# Patient Record
Sex: Male | Born: 1960 | Race: White | Hispanic: No | Marital: Single | Smoking: Never smoker
Health system: Southern US, Community
[De-identification: ages and names within clinical notes are randomized; demographics above are authoritative.]

## PROBLEM LIST (undated history)

## (undated) DIAGNOSIS — Z460 Encounter for fitting and adjustment of spectacles and contact lenses: Secondary | ICD-10-CM

## (undated) DIAGNOSIS — Z789 Other specified health status: Secondary | ICD-10-CM

## (undated) DIAGNOSIS — G4730 Sleep apnea, unspecified: Secondary | ICD-10-CM

## (undated) HISTORY — PX: COLONOSCOPY: SHX174

## (undated) HISTORY — PX: SHOULDER ARTHROSCOPY: SHX128

---

## 1999-07-24 HISTORY — PX: UVULOPALATOPLASTY: SHX2633

## 1999-07-24 HISTORY — PX: NASAL SEPTOPLASTY W/ TURBINOPLASTY: SHX2070

## 1999-10-30 ENCOUNTER — Encounter: Payer: Self-pay | Admitting: Internal Medicine

## 1999-10-30 ENCOUNTER — Encounter: Admission: RE | Admit: 1999-10-30 | Discharge: 1999-10-30 | Payer: Self-pay | Admitting: Internal Medicine

## 1999-12-29 ENCOUNTER — Encounter: Admission: RE | Admit: 1999-12-29 | Discharge: 1999-12-29 | Payer: Self-pay | Admitting: Neurosurgery

## 1999-12-29 ENCOUNTER — Encounter: Payer: Self-pay | Admitting: Neurosurgery

## 2000-04-10 ENCOUNTER — Ambulatory Visit (HOSPITAL_BASED_OUTPATIENT_CLINIC_OR_DEPARTMENT_OTHER): Admission: RE | Admit: 2000-04-10 | Discharge: 2000-04-10 | Payer: Self-pay | Admitting: Otolaryngology

## 2000-05-29 ENCOUNTER — Observation Stay (HOSPITAL_COMMUNITY): Admission: RE | Admit: 2000-05-29 | Discharge: 2000-05-30 | Payer: Self-pay | Admitting: Otolaryngology

## 2013-01-19 ENCOUNTER — Other Ambulatory Visit: Payer: Self-pay | Admitting: Orthopedic Surgery

## 2013-01-22 ENCOUNTER — Encounter (HOSPITAL_BASED_OUTPATIENT_CLINIC_OR_DEPARTMENT_OTHER): Payer: Self-pay | Admitting: *Deleted

## 2013-01-22 NOTE — Progress Notes (Signed)
No meds-no labs needed 

## 2013-01-26 ENCOUNTER — Encounter (HOSPITAL_BASED_OUTPATIENT_CLINIC_OR_DEPARTMENT_OTHER): Payer: Self-pay | Admitting: *Deleted

## 2013-01-27 ENCOUNTER — Encounter (HOSPITAL_BASED_OUTPATIENT_CLINIC_OR_DEPARTMENT_OTHER): Payer: Self-pay | Admitting: Anesthesiology

## 2013-01-27 ENCOUNTER — Ambulatory Visit (HOSPITAL_BASED_OUTPATIENT_CLINIC_OR_DEPARTMENT_OTHER): Payer: BC Managed Care – PPO | Admitting: Anesthesiology

## 2013-01-27 ENCOUNTER — Ambulatory Visit (HOSPITAL_BASED_OUTPATIENT_CLINIC_OR_DEPARTMENT_OTHER)
Admission: RE | Admit: 2013-01-27 | Discharge: 2013-01-27 | Disposition: A | Discharge status: Home / Self Care | Payer: BC Managed Care – PPO | Source: Ambulatory Visit | Attending: Orthopedic Surgery | Admitting: Orthopedic Surgery

## 2013-01-27 ENCOUNTER — Encounter (HOSPITAL_BASED_OUTPATIENT_CLINIC_OR_DEPARTMENT_OTHER)
Admission: RE | Disposition: A | Discharge status: Home / Self Care | Payer: Self-pay | Source: Ambulatory Visit | Attending: Orthopedic Surgery

## 2013-01-27 ENCOUNTER — Encounter (HOSPITAL_BASED_OUTPATIENT_CLINIC_OR_DEPARTMENT_OTHER): Payer: Self-pay | Admitting: *Deleted

## 2013-01-27 DIAGNOSIS — IMO0002 Reserved for concepts with insufficient information to code with codable children: Secondary | ICD-10-CM | POA: Insufficient documentation

## 2013-01-27 DIAGNOSIS — G4730 Sleep apnea, unspecified: Secondary | ICD-10-CM | POA: Insufficient documentation

## 2013-01-27 DIAGNOSIS — M249 Joint derangement, unspecified: Secondary | ICD-10-CM | POA: Insufficient documentation

## 2013-01-27 DIAGNOSIS — H9190 Unspecified hearing loss, unspecified ear: Secondary | ICD-10-CM | POA: Insufficient documentation

## 2013-01-27 HISTORY — DX: Encounter for fitting and adjustment of spectacles and contact lenses: Z46.0

## 2013-01-27 HISTORY — DX: Other specified health status: Z78.9

## 2013-01-27 HISTORY — PX: TENDON REPAIR: SHX5111

## 2013-01-27 HISTORY — DX: Sleep apnea, unspecified: G47.30

## 2013-01-27 SURGERY — TENDON REPAIR
Anesthesia: Choice | Site: Hand | Laterality: Right | Wound class: Clean

## 2013-01-27 MED ORDER — OXYCODONE HCL 5 MG/5ML PO SOLN: 5.0000 mg | Freq: Once | ORAL | Status: DC | PRN

## 2013-01-27 MED ORDER — LACTATED RINGERS IV SOLN
INTRAVENOUS | Status: DC
  Administered 2013-01-27 (×2): via INTRAVENOUS

## 2013-01-27 MED ORDER — HYDROMORPHONE HCL PF 1 MG/ML IJ SOLN
0.2500 mg | INTRAMUSCULAR | Status: DC | PRN
  Administered 2013-01-27 (×2): 0.5 mg via INTRAVENOUS

## 2013-01-27 MED ORDER — MIDAZOLAM HCL 5 MG/5ML IJ SOLN
INTRAMUSCULAR | Status: DC | PRN
  Administered 2013-01-27: 2 mg via INTRAVENOUS

## 2013-01-27 MED ORDER — BUPIVACAINE HCL (PF) 0.25 % IJ SOLN
INTRAMUSCULAR | Status: DC | PRN
  Administered 2013-01-27: 10 mL

## 2013-01-27 MED ORDER — CEFAZOLIN SODIUM-DEXTROSE 2-3 GM-% IV SOLR
2.0000 g | INTRAVENOUS | Status: AC
  Administered 2013-01-27: 2 g via INTRAVENOUS

## 2013-01-27 MED ORDER — FENTANYL CITRATE 0.05 MG/ML IJ SOLN
INTRAMUSCULAR | Status: DC | PRN
  Administered 2013-01-27: 25 ug via INTRAVENOUS
  Administered 2013-01-27: 50 ug via INTRAVENOUS

## 2013-01-27 MED ORDER — DEXAMETHASONE SODIUM PHOSPHATE 10 MG/ML IJ SOLN
INTRAMUSCULAR | Status: DC | PRN
  Administered 2013-01-27: 10 mg via INTRAVENOUS

## 2013-01-27 MED ORDER — FENTANYL CITRATE 0.05 MG/ML IJ SOLN: 50.0000 ug | INTRAMUSCULAR | Status: DC | PRN

## 2013-01-27 MED ORDER — CHLORHEXIDINE GLUCONATE 4 % EX LIQD: 60.0000 mL | Freq: Once | CUTANEOUS | Status: DC

## 2013-01-27 MED ORDER — LIDOCAINE HCL (CARDIAC) 20 MG/ML IV SOLN
INTRAVENOUS | Status: DC | PRN
  Administered 2013-01-27: 80 mg via INTRAVENOUS

## 2013-01-27 MED ORDER — ONDANSETRON HCL 4 MG/2ML IJ SOLN
INTRAMUSCULAR | Status: DC | PRN
  Administered 2013-01-27: 4 mg via INTRAVENOUS

## 2013-01-27 MED ORDER — PROPOFOL 10 MG/ML IV BOLUS
INTRAVENOUS | Status: DC | PRN
  Administered 2013-01-27: 180 mg via INTRAVENOUS

## 2013-01-27 MED ORDER — OXYCODONE HCL 5 MG PO TABS: 5.0000 mg | ORAL_TABLET | Freq: Once | ORAL | Status: DC | PRN

## 2013-01-27 MED ORDER — HYDROCODONE-ACETAMINOPHEN 5-325 MG PO TABS: ORAL_TABLET | ORAL | Status: DC

## 2013-01-27 MED ORDER — MIDAZOLAM HCL 2 MG/2ML IJ SOLN: 1.0000 mg | INTRAMUSCULAR | Status: DC | PRN

## 2013-01-27 SURGICAL SUPPLY — 93 items
BAG DECANTER FOR FLEXI CONT (MISCELLANEOUS) IMPLANT
BALL CTTN LRG ABS STRL LF (GAUZE/BANDAGES/DRESSINGS)
BANDAGE CONFORM 2  STR LF (GAUZE/BANDAGES/DRESSINGS) IMPLANT
BANDAGE ELASTIC 3 VELCRO ST LF (GAUZE/BANDAGES/DRESSINGS) ×2 IMPLANT
BANDAGE GAUZE ELAST BULKY 4 IN (GAUZE/BANDAGES/DRESSINGS) ×1 IMPLANT
BLADE MINI RND TIP GREEN BEAV (BLADE) IMPLANT
BLADE SURG 15 STRL LF DISP TIS (BLADE) ×2 IMPLANT
BLADE SURG 15 STRL SS (BLADE) ×4
BNDG CMPR 9X4 STRL LF SNTH (GAUZE/BANDAGES/DRESSINGS) ×1
BNDG CMPR MD 5X2 ELC HKLP STRL (GAUZE/BANDAGES/DRESSINGS)
BNDG COHESIVE 3X5 TAN STRL LF (GAUZE/BANDAGES/DRESSINGS) ×1 IMPLANT
BNDG ELASTIC 2 VLCR STRL LF (GAUZE/BANDAGES/DRESSINGS) IMPLANT
BNDG ESMARK 4X9 LF (GAUZE/BANDAGES/DRESSINGS) ×2 IMPLANT
BNDG PLASTER X FAST 3X3 WHT LF (CAST SUPPLIES) ×20 IMPLANT
BNDG PLSTR 9X3 FST ST WHT (CAST SUPPLIES) ×20
CHLORAPREP W/TINT 26ML (MISCELLANEOUS) ×2 IMPLANT
CLOTH BEACON ORANGE TIMEOUT ST (SAFETY) ×2 IMPLANT
CORDS BIPOLAR (ELECTRODE) ×2 IMPLANT
COTTONBALL LRG STERILE PKG (GAUZE/BANDAGES/DRESSINGS) IMPLANT
COVER MAYO STAND STRL (DRAPES) ×2 IMPLANT
COVER TABLE BACK 60X90 (DRAPES) ×2 IMPLANT
CUFF TOURNIQUET SINGLE 18IN (TOURNIQUET CUFF) ×2 IMPLANT
DECANTER SPIKE VIAL GLASS SM (MISCELLANEOUS) IMPLANT
DRAIN TLS ROUND 10FR (DRAIN) IMPLANT
DRAPE EXTREMITY T 121X128X90 (DRAPE) ×2 IMPLANT
DRAPE OEC MINIVIEW 54X84 (DRAPES) IMPLANT
DRAPE SURG 17X23 STRL (DRAPES) ×2 IMPLANT
DRSG PAD ABDOMINAL 8X10 ST (GAUZE/BANDAGES/DRESSINGS) IMPLANT
GAUZE SPONGE 4X4 16PLY XRAY LF (GAUZE/BANDAGES/DRESSINGS) IMPLANT
GAUZE XEROFORM 1X8 LF (GAUZE/BANDAGES/DRESSINGS) ×2 IMPLANT
GLOVE BIO SURGEON STRL SZ7.5 (GLOVE) ×2 IMPLANT
GLOVE BIOGEL PI IND STRL 8 (GLOVE) ×1 IMPLANT
GLOVE BIOGEL PI IND STRL 8.5 (GLOVE) IMPLANT
GLOVE BIOGEL PI INDICATOR 8 (GLOVE) ×1
GLOVE BIOGEL PI INDICATOR 8.5 (GLOVE) ×1
GLOVE SURG ORTHO 8.0 STRL STRW (GLOVE) ×1 IMPLANT
GOWN BRE IMP PREV XXLGXLNG (GOWN DISPOSABLE) ×2 IMPLANT
GOWN PREVENTION PLUS XLARGE (GOWN DISPOSABLE) ×2 IMPLANT
GOWN PREVENTION PLUS XXLARGE (GOWN DISPOSABLE) ×3 IMPLANT
KWIRE 4.0 X .035IN (WIRE) IMPLANT
LOOP VESSEL MAXI BLUE (MISCELLANEOUS) IMPLANT
NDL HYPO 25X1 1.5 SAFETY (NEEDLE) IMPLANT
NDL KEITH (NEEDLE) IMPLANT
NEEDLE HYPO 22GX1.5 SAFETY (NEEDLE) IMPLANT
NEEDLE HYPO 25X1 1.5 SAFETY (NEEDLE) ×2 IMPLANT
NEEDLE KEITH (NEEDLE) IMPLANT
NS IRRIG 1000ML POUR BTL (IV SOLUTION) ×2 IMPLANT
PACK BASIN DAY SURGERY FS (CUSTOM PROCEDURE TRAY) ×2 IMPLANT
PAD CAST 3X4 CTTN HI CHSV (CAST SUPPLIES) ×1 IMPLANT
PAD CAST 4YDX4 CTTN HI CHSV (CAST SUPPLIES) IMPLANT
PADDING CAST ABS 3INX4YD NS (CAST SUPPLIES) ×1
PADDING CAST ABS 4INX4YD NS (CAST SUPPLIES) ×1
PADDING CAST ABS COTTON 3X4 (CAST SUPPLIES) IMPLANT
PADDING CAST ABS COTTON 4X4 ST (CAST SUPPLIES) ×1 IMPLANT
PADDING CAST COTTON 3X4 STRL (CAST SUPPLIES) ×2
PADDING CAST COTTON 4X4 STRL (CAST SUPPLIES)
SLEEVE SCD COMPRESS KNEE MED (MISCELLANEOUS) IMPLANT
SPLINT PLASTER CAST XFAST 3X15 (CAST SUPPLIES) IMPLANT
SPLINT PLASTER CAST XFAST 4X15 (CAST SUPPLIES) IMPLANT
SPLINT PLASTER XTRA FAST SET 4 (CAST SUPPLIES)
SPLINT PLASTER XTRA FASTSET 3X (CAST SUPPLIES)
SPONGE GAUZE 4X4 12PLY (GAUZE/BANDAGES/DRESSINGS) ×2 IMPLANT
STOCKINETTE 4X48 STRL (DRAPES) ×2 IMPLANT
SUT CHROMIC 5 0 P 3 (SUTURE) IMPLANT
SUT ETHIBOND 3-0 V-5 (SUTURE) IMPLANT
SUT ETHILON 3 0 PS 1 (SUTURE) IMPLANT
SUT ETHILON 4 0 PS 2 18 (SUTURE) ×2 IMPLANT
SUT FIBERWIRE 3-0 18 TAPR NDL (SUTURE)
SUT FIBERWIRE 4-0 18 DIAM BLUE (SUTURE)
SUT MERSILENE 2.0 SH NDLE (SUTURE) IMPLANT
SUT MERSILENE 3 0 FS 1 (SUTURE) IMPLANT
SUT MERSILENE 4 0 P 3 (SUTURE) ×1 IMPLANT
SUT POLY BUTTON 15MM (SUTURE) IMPLANT
SUT PROLENE 2 0 SH DA (SUTURE) IMPLANT
SUT PROLENE 6 0 P 1 18 (SUTURE) IMPLANT
SUT SILK 2 0 FS (SUTURE) IMPLANT
SUT SILK 4 0 PS 2 (SUTURE) IMPLANT
SUT STEEL 4 0 V 26 (SUTURE) IMPLANT
SUT VIC AB 2-0 SH 27 (SUTURE) ×2
SUT VIC AB 2-0 SH 27XBRD (SUTURE) IMPLANT
SUT VIC AB 3-0 PS1 18 (SUTURE)
SUT VIC AB 3-0 PS1 18XBRD (SUTURE) IMPLANT
SUT VIC AB 4-0 P-3 18XBRD (SUTURE) IMPLANT
SUT VIC AB 4-0 P3 18 (SUTURE)
SUT VICRYL 4-0 PS2 18IN ABS (SUTURE) IMPLANT
SUT VICRYL RAPIDE 4/0 PS 2 (SUTURE) ×1 IMPLANT
SUTURE FIBERWR 3-0 18 TAPR NDL (SUTURE) IMPLANT
SUTURE FIBERWR 4-0 18 DIA BLUE (SUTURE) IMPLANT
SYR BULB 3OZ (MISCELLANEOUS) ×2 IMPLANT
SYR CONTROL 10ML LL (SYRINGE) ×1 IMPLANT
TOWEL OR 17X24 6PK STRL BLUE (TOWEL DISPOSABLE) ×3 IMPLANT
TUBE FEEDING 5FR 15 INCH (TUBING) IMPLANT
UNDERPAD 30X30 INCONTINENT (UNDERPADS AND DIAPERS) ×2 IMPLANT

## 2013-01-27 NOTE — Brief Op Note (Signed)
01/27/2013  12:26 PM  PATIENT:  Barbaraann Faster [facility]  52 y.o. male  PRE-OPERATIVE DIAGNOSIS:  RIGHT EPL RUPTURE  POST-OPERATIVE DIAGNOSIS:  RIGHT EPL RUPTURE  PROCEDURE:  Procedure(s): EXTENSOR INDICIS PROPRIUS TO EXTENSOR PILLICIS LONGUS TRANSFER  SURGEON:  Surgeon(s): Tami Ribas, MD Nicki Reaper, MD  PHYSICIAN ASSISTANT:   ASSISTANTS: Cindee Salt, MD   ANESTHESIA:   general  EBL:  Total I/O In: 1000 [I.V.:1000] Out: -   DRAINS: none   LOCAL MEDICATIONS USED:  MARCAINE     SPECIMEN:  No Specimen  DISPOSITION OF SPECIMEN:  N/A  COUNTS:  YES  TOURNIQUET:   Total Tourniquet Time Documented: Upper Arm (Right) - 54 minutes Total: Upper Arm (Right) - 54 minutes   DICTATION: .Other Dictation: Dictation Number [id number]  PLAN OF CARE: Discharge to home after PACU

## 2013-01-27 NOTE — H&P (Signed)
Peter Potter is an 52 y.o. male.   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  2 weeks ago was doing a lot of work and noted inability to extend right thumb at IP joint afterward.  No previous issues with this.  No other injury at this time.  Past Medical History  Diagnosis Date  . Medical history non-contributory   . Contact lens/glasses fitting     wears contacts or glasses  . Sleep apnea     had a study 93,surgery-study 08-did not have sleep apnea    Past Surgical History  Procedure Laterality Date  . Shoulder arthroscopy      right shoulder-2005  . Colonoscopy    . Uvulopalatoplasty  2001  . Nasal septoplasty w/ turbinoplasty  2001    History reviewed. No pertinent family history. Social History:  reports that he has never smoked. He does not have any smokeless tobacco history on file. He reports that  drinks alcohol. He reports that he does not use illicit drugs.  Allergies: No Known Allergies  Medications Prior to Admission  Medication Sig Dispense Refill  . Multiple Vitamins-Minerals (MULTIVITAMIN WITH MINERALS) tablet Take 1 tablet by mouth daily.        No results found for this or any previous visit (from the past 48 hour(s)).  No results found.   A comprehensive review of systems was negative except for: Eyes: positive for contacts/glasses Ears, nose, mouth, throat, and face: positive for hearing loss  Blood pressure 119/73, pulse 53, temperature 97.8 F (36.6 C), temperature source Oral, resp. rate 16, height 6' (1.829 m), weight 166 lb 4 oz (75.411 kg), SpO2 100.00%.  General appearance: alert, cooperative and appears stated age Head: Normocephalic, without obvious abnormality, atraumatic Neck: supple, symmetrical, trachea midline Resp: clear to auscultation bilaterally Cardio: regular rate and rhythm GI: non tender Extremities: intact  sensation and capillary refill all digits.  Unable to extend IP joint of right thumb.  intact fpl/io.  left thumb intact epl/fpl/io.  no wounds. Pulses: 2+ and symmetric Skin: Skin color, texture, turgor normal. No rashes or lesions Neurologic: Grossly normal Incision/Wound: na  Assessment/Plan Right thumb EPL laceration.  Non operative and operative treatment options were discussed with the patient and patient wishes to proceed with operative treatment. Discussed primary repair vs PL graft vs EIP to EPL transfer.  Risks, benefits, and alternatives of surgery were discussed and the patient agrees with the plan of care.   Peter Potter R 01/27/2013, 10:45 AM

## 2013-01-27 NOTE — Transfer of Care (Signed)
Immediate Anesthesia Transfer of Care Note  Patient: Peter Potter [facility]  Procedure(s) Performed: Procedure(s): EXTENSOR INDICIS PROPRIUS TO EXTENSOR PILLICIS LONGUS TRANSFER (Right)  Patient Location: PACU  Anesthesia Type:General  Level of Consciousness: awake, sedated and patient cooperative  Airway & Oxygen Therapy: Patient Spontanous Breathing and Patient connected to face mask oxygen  Post-op Assessment: Report given to PACU RN and Post -op Vital signs reviewed and stable  Post vital signs: Reviewed and stable  Complications: No apparent anesthesia complications

## 2013-01-27 NOTE — Anesthesia Procedure Notes (Signed)
Procedure Name: LMA Insertion Date/Time: 01/27/2013 11:17 AM Performed by: Gar Gibbon Pre-anesthesia Checklist: Patient identified, Emergency Drugs available, Suction available and Patient being monitored Patient Re-evaluated:Patient Re-evaluated prior to inductionOxygen Delivery Method: Circle System Utilized Preoxygenation: Pre-oxygenation with 100% oxygen Intubation Type: IV induction Ventilation: Mask ventilation without difficulty LMA: LMA inserted LMA Size: 5.0 Number of attempts: 1 Airway Equipment and Method: bite block Placement Confirmation: positive ETCO2 Tube secured with: Tape Dental Injury: Teeth and Oropharynx as per pre-operative assessment

## 2013-01-27 NOTE — Op Note (Signed)
[id number] 

## 2013-01-27 NOTE — Anesthesia Preprocedure Evaluation (Signed)
Anesthesia Evaluation  Patient identified by MRN, date of birth, ID band Patient awake    Reviewed: Allergy & Precautions, H&P , NPO status , Patient's Chart, lab work & pertinent test results  Airway Mallampati: I TM Distance: >3 FB Neck ROM: Full    Dental   Pulmonary sleep apnea ,  breath sounds clear to auscultation        Cardiovascular Rate:Normal     Neuro/Psych    GI/Hepatic   Endo/Other    Renal/GU      Musculoskeletal   Abdominal   Peds  Hematology   Anesthesia Other Findings   Reproductive/Obstetrics                           Anesthesia Physical Anesthesia Plan  ASA: II  Anesthesia Plan: General   Post-op Pain Management:    Induction: Intravenous  Airway Management Planned: LMA  Additional Equipment:   Intra-op Plan:   Post-operative Plan: Extubation in OR  Informed Consent: I have reviewed the patients History and Physical, chart, labs and discussed the procedure including the risks, benefits and alternatives for the proposed anesthesia with the patient or authorized representative who has indicated his/her understanding and acceptance.     Plan Discussed with: CRNA and Surgeon  Anesthesia Plan Comments:         Anesthesia Quick Evaluation

## 2013-01-27 NOTE — Op Note (Signed)
[name], [name]:  [id number]  MEDICAL RECORD NO.:  [id number]  LOCATION:                                 FACILITY:  PHYSICIAN:  Betha Loa, MD             DATE OF BIRTH:  DATE OF PROCEDURE:  01/27/2013 DATE OF DISCHARGE:                              OPERATIVE REPORT   PREOPERATIVE DIAGNOSIS:  Right thumb extensor pollicis longus rupture.  POSTOPERATIVE DIAGNOSIS:  Right thumb extensor pollicis longus rupture.  PROCEDURE:  Right thumb EIP to EPL transfer.  SURGEON:  Betha Loa, MD  ASSISTANT:  Cindee Salt, MD.  ANESTHESIA:  General.  IV FLUIDS:  Per anesthesia flow sheet.  ESTIMATED BLOOD LOSS:  Minimal.  COMPLICATIONS:  None.  SPECIMENS:  None.  TOURNIQUET TIME:  54 minutes.  DISPOSITION:  Stable to PACU.  INDICATIONS:  Peter Potter is a 52 year old male who states approximately 8 months ago he lacerated the dorsum of his wrist with an X-Acto knife. He did not seek medical attention.  He had no deficits.  He noted approximately 2 weeks ago he was using his hand for standing and was unable to extend the IP joint of his right thumb.  He presented to the office.  On evaluation, he had inability to extend the IP joint of the thumb.  I discussed with Peter Potter the nature of the condition.  We discussed nonoperative and operative treatment options.  Risks, benefits, and alternatives or surgery were discussed including the risk of blood loss, infection, damage to nerves, vessels, tendons, ligaments, bone; failure of surgery; need for additional surgery, complications with wound healing, continued pain, and continued loss of range of motion.  He voiced understanding of these risks and elected to proceed.  OPERATIVE COURSE:  After being identified preoperatively by myself, the patient and I agreed upon procedure and site of procedure.  Surgical site was marked.  The risks, benefits, and alternatives of surgery were reviewed and he wished to  proceed.  Surgical consent had been signed. He was given IV Ancef as preoperative antibiotic prophylaxis.  He was transported to the operating room and placed on the operating table in a supine position with the right upper extremity on arm board.  General anesthesia was induced by the anesthesiologist.  The right upper extremity was prepped and draped in normal sterile orthopedic fashion. Surgical pause was performed between surgeons, anesthesia, and operating room staff, and all were in agreement as to the patient, procedure, and site of procedure.  Tourniquet at the proximal aspect of the extremity was inflated to 250 mmHg after exsanguination of the limb with Esmarch bandage.  Incision was made at the dorsum of the thumb just distal to the wrist joint where the healed laceration was.  This was carried into subcutaneous tissues by spreading technique.  Bipolar electrocautery was used to obtain hemostasis.  Superficial branches of the radial nerve were protected.  Pseudo tendon was identified.  There was a very thick pseudo tendon.  This had ruptured.  The distal end of the actual tendon was identified.  There was minimal actual  tendon fibers at the proximal aspect of the pseudo tendon.  Incision was made at the dorsum of the wrist.  This was carried into subcutaneous tissues by spreading technique.  Again,  bipolar electrocautery was used to obtain hemostasis.  The 4th dorsal compartment was identified.  It was divided for approximately 4 mm at the distal end.  The third dorsal compartment was identified and entered.  There was no tendon within the third dorsal compartment.  There was a very small pseudo tendon.  The proximal end of the tendon was not able to be retrieved.  It was felt that the initial injury was enough that the muscle was contracted and EIP to EPL transfer would be appropriate.  An incision was made at the dorsum of the MP joint of the index finger.  It was carried  into subcutaneous tissues by spreading technique.  The EIP tendon was identified at the wrist incision by its distal muscle belly.  This was the ulnar-sided tendon at the MP joint.  The tendon was divided and the distal tendon stump sewn back to the EDC tendon.  This was done with 4-0 Mersilene suture.  The tendon was retrieved at the wrist incision and then transferred into the thumb incision subcutaneously.  Care was taken to place it deep to the neurovascular structures.  The wrist and index finger incisions were irrigated with sterile saline and closed with 4-0 nylon in a horizontal mattress fashion.  The EIP to EPL transfer was performed in a Pulvertaft fashion.  Three weeds were obtained and the tendon was able to be folded back over itself in the repair.  4-0 Mersilene suture was used to secure this at each of the weed points and proximally.  The tendon edges were able to be sewn back to the tendon to provide a cone shape without any significant edges.  The subcutaneous tissues were freed up to try and prevent any catching.  The tension was set so that with the wrist extended the thumb tip came down to the tip of the index finger and that the thumb fully extended with the wrist in flexion.  The wound was copiously irrigated with sterile saline.  It was closed with 4-0 nylon in a horizontal mattress fashion.  The wounds were then injected with 10 mL of 0.25% plain Marcaine to aid in postoperative analgesia.  They were dressed with sterile Xeroform, 4x4s, and wrapped with a Kerlix bandage. A thumb spica splint with the thumb in full extension was placed and wrapped with Kerlix and Ace bandage.  The tourniquet was deflated at 54 minutes.  Fingertips were pink with brisk capillary refill after deflation of tourniquet.  Operative drapes were broken down and the patient was awoken from anesthesia safely.  He was transferred back to the stretcher and taken to PACU in stable condition.  I  will see him back in the office in 1 week for postoperative followup.  We will give him Norco 5/325, 1-2 p.o. q.6 hours p.r.n. pain, dispensed #40.     Betha Loa, MD     KK/MEDQ  D:  01/27/2013  T:  01/27/2013  Job:  [id number]

## 2013-01-28 ENCOUNTER — Encounter (HOSPITAL_BASED_OUTPATIENT_CLINIC_OR_DEPARTMENT_OTHER): Payer: Self-pay | Admitting: Orthopedic Surgery

## 2013-01-29 NOTE — Anesthesia Postprocedure Evaluation (Signed)
  Anesthesia Post-op Note  Patient: Peter Potter [facility]  Procedure(s) Performed: Procedure(s): EXTENSOR INDICIS PROPRIUS TO EXTENSOR PILLICIS LONGUS TRANSFER (Right)  Patient Location: PACU  Anesthesia Type:General  Level of Consciousness: awake and alert   Airway and Oxygen Therapy: Patient Spontanous Breathing  Post-op Pain: mild  Post-op Assessment: Post-op Vital signs reviewed, Patient's Cardiovascular Status Stable, Respiratory Function Stable, Patent Airway, No signs of Nausea or vomiting and Pain level controlled  Post-op Vital Signs: stable  Complications: No apparent anesthesia complications

## 2015-09-30 ENCOUNTER — Ambulatory Visit (INDEPENDENT_AMBULATORY_CARE_PROVIDER_SITE_OTHER): Payer: Managed Care, Other (non HMO)

## 2015-09-30 ENCOUNTER — Ambulatory Visit (INDEPENDENT_AMBULATORY_CARE_PROVIDER_SITE_OTHER): Payer: Managed Care, Other (non HMO) | Admitting: Podiatry

## 2015-09-30 ENCOUNTER — Encounter: Payer: Self-pay | Admitting: Podiatry

## 2015-09-30 DIAGNOSIS — M21629 Bunionette of unspecified foot: Secondary | ICD-10-CM

## 2015-09-30 DIAGNOSIS — M79671 Pain in right foot: Secondary | ICD-10-CM | POA: Diagnosis not present

## 2015-09-30 DIAGNOSIS — M79672 Pain in left foot: Secondary | ICD-10-CM

## 2015-09-30 DIAGNOSIS — M779 Enthesopathy, unspecified: Secondary | ICD-10-CM | POA: Diagnosis not present

## 2015-09-30 MED ORDER — TRIAMCINOLONE ACETONIDE 10 MG/ML IJ SUSP
10.0000 mg | Freq: Once | INTRAMUSCULAR | Status: AC
  Administered 2015-09-30: 10 mg

## 2015-09-30 NOTE — Progress Notes (Signed)
   Subjective:    Patient ID: Peter Potter, male    DOB: [birth year], 55 y.o.   MRN: [id number]  HPI B/L KELLER BUNION BEEN SORE FOR  MONTHS. FEET ARE GETTING WORSE ESPECIALLY WHEN WEARING SHOES-TRIED NO TREATMENT.   Review of Systems  All other systems reviewed and are negative.      Objective:   Physical Exam        Assessment & Plan:

## 2015-10-01 NOTE — Progress Notes (Signed)
Subjective:     Patient ID: Peter Potter, male   DOB: May 19, 1961, 55 y.o.   MRN: [id number]  HPI patient states I've had a lot of pain in the outside of his right foot when I wear tighter shoes and I've noticed this bump getting bigger and I also was concerned about the left one. It's been getting gradually worse over the last 6 months to year   Review of Systems  All other systems reviewed and are negative.      Objective:   Physical Exam  Constitutional: He is oriented to person, place, and time.  Cardiovascular: Intact distal pulses.   Musculoskeletal: Normal range of motion.  Neurological: He is oriented to person, place, and time.  Skin: Skin is warm.  Nursing note and vitals reviewed.  neurovascular status intact muscle strength adequate range of motion within normal limits with patient noted to have discomfort in the right fifth metatarsal head with moderate fluid around it and also some discomfort around the fifth metatarsal head left but minimal when palpated. 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 Do not recommend aggressive surgery and less symptoms were to get worse or if symptoms do not respond to conservative care and today I did inject around the capsule 3 mg Kenalog 5 mg Xylocaine and applied dressing. Patient will    wear wider shoes will be seen back as needed  X-ray report indicates elevation of the 4 and 5 intermetatarsal angle right with deformity and left appears much more stable

## 2018-06-12 ENCOUNTER — Other Ambulatory Visit: Payer: Self-pay | Admitting: Family Medicine

## 2018-06-12 DIAGNOSIS — D171 Benign lipomatous neoplasm of skin and subcutaneous tissue of trunk: Secondary | ICD-10-CM

## 2018-06-17 ENCOUNTER — Inpatient Hospital Stay: Admission: RE | Admit: 2018-06-17 | Payer: Self-pay | Source: Ambulatory Visit

## 2018-06-17 ENCOUNTER — Ambulatory Visit
Admission: RE | Admit: 2018-06-17 | Discharge: 2018-06-17 | Disposition: A | Discharge status: Home / Self Care | Payer: 59 | Source: Ambulatory Visit | Attending: Family Medicine | Admitting: Family Medicine

## 2018-06-17 DIAGNOSIS — D171 Benign lipomatous neoplasm of skin and subcutaneous tissue of trunk: Secondary | ICD-10-CM

## 2019-08-11 DIAGNOSIS — Z20828 Contact with and (suspected) exposure to other viral communicable diseases: Secondary | ICD-10-CM | POA: Diagnosis not present

## 2019-08-11 DIAGNOSIS — Z03818 Encounter for observation for suspected exposure to other biological agents ruled out: Secondary | ICD-10-CM | POA: Diagnosis not present

## 2020-03-31 DIAGNOSIS — Z125 Encounter for screening for malignant neoplasm of prostate: Secondary | ICD-10-CM | POA: Diagnosis not present

## 2020-03-31 DIAGNOSIS — Z0000 Encounter for general adult medical examination without abnormal findings: Secondary | ICD-10-CM | POA: Diagnosis not present

## 2020-03-31 DIAGNOSIS — Z13220 Encounter for screening for lipoid disorders: Secondary | ICD-10-CM | POA: Diagnosis not present

## 2020-07-18 DIAGNOSIS — Z79899 Other long term (current) drug therapy: Secondary | ICD-10-CM | POA: Diagnosis not present

## 2020-07-18 DIAGNOSIS — M25422 Effusion, left elbow: Secondary | ICD-10-CM | POA: Diagnosis not present

## 2020-07-18 DIAGNOSIS — M25522 Pain in left elbow: Secondary | ICD-10-CM | POA: Diagnosis not present

## 2020-07-18 DIAGNOSIS — M25532 Pain in left wrist: Secondary | ICD-10-CM | POA: Diagnosis not present

## 2020-07-18 DIAGNOSIS — Z20822 Contact with and (suspected) exposure to covid-19: Secondary | ICD-10-CM | POA: Diagnosis not present

## 2020-07-18 DIAGNOSIS — M79642 Pain in left hand: Secondary | ICD-10-CM | POA: Diagnosis not present

## 2020-07-18 DIAGNOSIS — W19XXXA Unspecified fall, initial encounter: Secondary | ICD-10-CM | POA: Diagnosis not present

## 2020-07-20 DIAGNOSIS — S52042A Displaced fracture of coronoid process of left ulna, initial encounter for closed fracture: Secondary | ICD-10-CM | POA: Diagnosis not present

## 2020-07-20 DIAGNOSIS — S52042D Displaced fracture of coronoid process of left ulna, subsequent encounter for closed fracture with routine healing: Secondary | ICD-10-CM | POA: Diagnosis not present

## 2020-07-20 DIAGNOSIS — M25622 Stiffness of left elbow, not elsewhere classified: Secondary | ICD-10-CM | POA: Diagnosis not present

## 2020-07-20 DIAGNOSIS — M25422 Effusion, left elbow: Secondary | ICD-10-CM | POA: Diagnosis not present

## 2020-07-20 DIAGNOSIS — M25522 Pain in left elbow: Secondary | ICD-10-CM | POA: Diagnosis not present

## 2020-08-03 DIAGNOSIS — S52042A Displaced fracture of coronoid process of left ulna, initial encounter for closed fracture: Secondary | ICD-10-CM | POA: Diagnosis not present

## 2020-09-05 DIAGNOSIS — S52042A Displaced fracture of coronoid process of left ulna, initial encounter for closed fracture: Secondary | ICD-10-CM | POA: Diagnosis not present

## 2021-04-11 DIAGNOSIS — F5221 Male erectile disorder: Secondary | ICD-10-CM | POA: Diagnosis not present

## 2021-04-11 DIAGNOSIS — Z125 Encounter for screening for malignant neoplasm of prostate: Secondary | ICD-10-CM | POA: Diagnosis not present

## 2021-04-11 DIAGNOSIS — Z13220 Encounter for screening for lipoid disorders: Secondary | ICD-10-CM | POA: Diagnosis not present

## 2021-04-11 DIAGNOSIS — Z0000 Encounter for general adult medical examination without abnormal findings: Secondary | ICD-10-CM | POA: Diagnosis not present

## 2021-06-13 DIAGNOSIS — Z125 Encounter for screening for malignant neoplasm of prostate: Secondary | ICD-10-CM | POA: Diagnosis not present

## 2021-07-31 DIAGNOSIS — Z049 Encounter for examination and observation for unspecified reason: Secondary | ICD-10-CM | POA: Diagnosis not present

## 2021-07-31 DIAGNOSIS — Z79899 Other long term (current) drug therapy: Secondary | ICD-10-CM | POA: Diagnosis not present

## 2021-07-31 DIAGNOSIS — G44229 Chronic tension-type headache, not intractable: Secondary | ICD-10-CM | POA: Diagnosis not present

## 2021-08-01 ENCOUNTER — Other Ambulatory Visit: Payer: Self-pay | Admitting: Specialist

## 2021-08-01 DIAGNOSIS — R519 Headache, unspecified: Secondary | ICD-10-CM

## 2021-08-16 ENCOUNTER — Ambulatory Visit
Admission: RE | Admit: 2021-08-16 | Discharge: 2021-08-16 | Disposition: A | Discharge status: Home / Self Care | Payer: BC Managed Care – PPO | Source: Ambulatory Visit | Attending: Specialist | Admitting: Specialist

## 2021-08-16 ENCOUNTER — Other Ambulatory Visit: Payer: Self-pay

## 2021-08-16 DIAGNOSIS — R519 Headache, unspecified: Secondary | ICD-10-CM

## 2021-09-25 DIAGNOSIS — G44229 Chronic tension-type headache, not intractable: Secondary | ICD-10-CM | POA: Diagnosis not present

## 2021-12-02 DIAGNOSIS — R1032 Left lower quadrant pain: Secondary | ICD-10-CM | POA: Diagnosis not present

## 2021-12-02 DIAGNOSIS — R319 Hematuria, unspecified: Secondary | ICD-10-CM | POA: Diagnosis not present

## 2021-12-05 DIAGNOSIS — R319 Hematuria, unspecified: Secondary | ICD-10-CM | POA: Diagnosis not present

## 2021-12-07 DIAGNOSIS — N134 Hydroureter: Secondary | ICD-10-CM | POA: Diagnosis not present

## 2021-12-07 DIAGNOSIS — N132 Hydronephrosis with renal and ureteral calculous obstruction: Secondary | ICD-10-CM | POA: Diagnosis not present

## 2021-12-08 DIAGNOSIS — N201 Calculus of ureter: Secondary | ICD-10-CM | POA: Diagnosis not present

## 2021-12-08 DIAGNOSIS — R8271 Bacteriuria: Secondary | ICD-10-CM | POA: Diagnosis not present

## 2021-12-22 DIAGNOSIS — N201 Calculus of ureter: Secondary | ICD-10-CM | POA: Diagnosis not present

## 2021-12-28 DIAGNOSIS — G44229 Chronic tension-type headache, not intractable: Secondary | ICD-10-CM | POA: Diagnosis not present

## 2022-02-02 DIAGNOSIS — N132 Hydronephrosis with renal and ureteral calculous obstruction: Secondary | ICD-10-CM | POA: Diagnosis not present

## 2022-02-27 DIAGNOSIS — K820 Obstruction of gallbladder: Secondary | ICD-10-CM | POA: Diagnosis not present

## 2022-02-27 DIAGNOSIS — N281 Cyst of kidney, acquired: Secondary | ICD-10-CM | POA: Diagnosis not present

## 2022-02-27 DIAGNOSIS — R310 Gross hematuria: Secondary | ICD-10-CM | POA: Diagnosis not present

## 2022-02-27 DIAGNOSIS — R8271 Bacteriuria: Secondary | ICD-10-CM | POA: Diagnosis not present

## 2022-02-27 DIAGNOSIS — K4020 Bilateral inguinal hernia, without obstruction or gangrene, not specified as recurrent: Secondary | ICD-10-CM | POA: Diagnosis not present

## 2022-02-27 DIAGNOSIS — N201 Calculus of ureter: Secondary | ICD-10-CM | POA: Diagnosis not present

## 2022-04-27 DIAGNOSIS — Z1211 Encounter for screening for malignant neoplasm of colon: Secondary | ICD-10-CM | POA: Diagnosis not present

## 2022-05-31 DIAGNOSIS — N401 Enlarged prostate with lower urinary tract symptoms: Secondary | ICD-10-CM | POA: Diagnosis not present

## 2022-05-31 DIAGNOSIS — R351 Nocturia: Secondary | ICD-10-CM | POA: Diagnosis not present

## 2022-09-25 DIAGNOSIS — Z130 Encounter for screening for diseases of the blood and blood-forming organs and certain disorders involving the immune mechanism: Secondary | ICD-10-CM | POA: Diagnosis not present

## 2022-09-25 DIAGNOSIS — Z131 Encounter for screening for diabetes mellitus: Secondary | ICD-10-CM | POA: Diagnosis not present

## 2022-09-25 DIAGNOSIS — Z125 Encounter for screening for malignant neoplasm of prostate: Secondary | ICD-10-CM | POA: Diagnosis not present

## 2022-09-25 DIAGNOSIS — Z0000 Encounter for general adult medical examination without abnormal findings: Secondary | ICD-10-CM | POA: Diagnosis not present

## 2022-09-25 DIAGNOSIS — Z13220 Encounter for screening for lipoid disorders: Secondary | ICD-10-CM | POA: Diagnosis not present

## 2022-10-08 DIAGNOSIS — M25551 Pain in right hip: Secondary | ICD-10-CM | POA: Diagnosis not present

## 2022-10-16 DIAGNOSIS — M25551 Pain in right hip: Secondary | ICD-10-CM | POA: Diagnosis not present

## 2023-07-01 IMAGING — MR MR HEAD W/O CM
11 series · 48 of 48 positions shown · non-contrast
Comparison: None.

CLINICAL DATA: Headaches, head injury 5323

EXAM:
MRI HEAD WITHOUT CONTRAST
TECHNIQUE: Multiplanar, multiecho pulse sequences of the brain and surrounding
structures were obtained without intravenous contrast.

[Series 5: T1 · sagittal · 4.0mm · 0.75mm/px · 2 of 31 slices shown (1 of 2)]
[im 1/31]
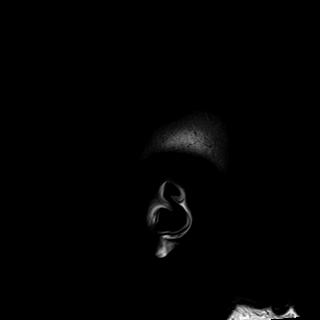
[im 31/31]
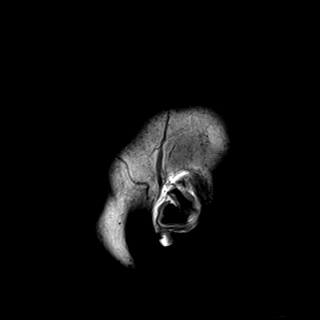

[Series 6: DWI · axial · 3.0mm · 0.94mm/px · z∈[-42,+93]mm · 10 of 159 slices shown (1 of 3)]
[im 1/159]
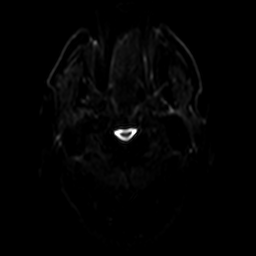
[im 18/159]
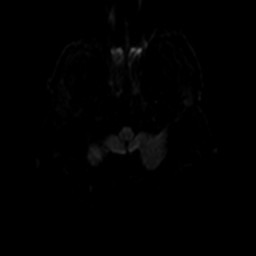
[im 36/159]
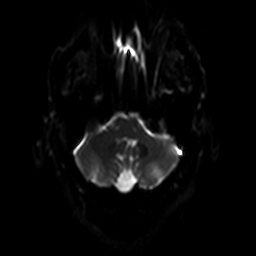
[im 53/159]
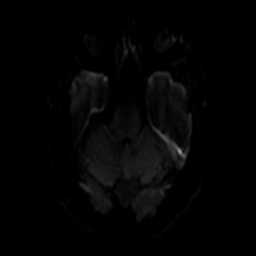
[im 71/159]
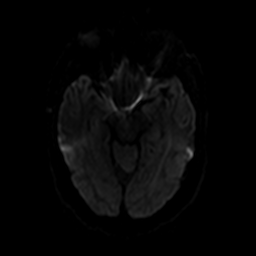
[im 88/159]
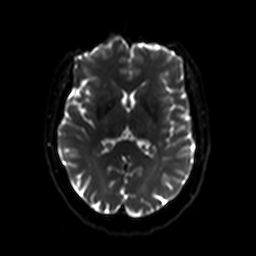
[im 106/159]
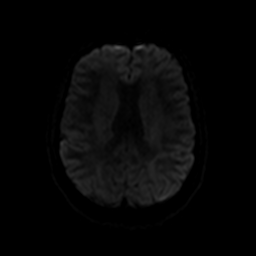
[im 123/159]
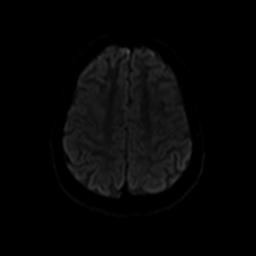
[im 141/159]
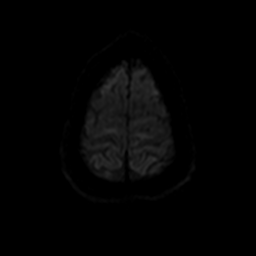
[im 159/159]
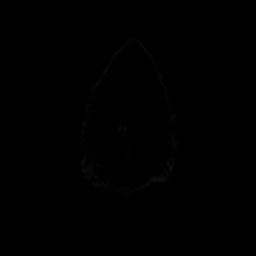

[Series 7: ax dwi_tracew · axial · 3.0mm · 0.94mm/px · z∈[-42,+93]mm · 5 of 80 slices shown]
[im 1/80]
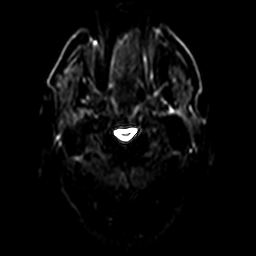
[im 20/80]
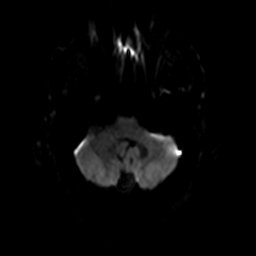
[im 40/80]
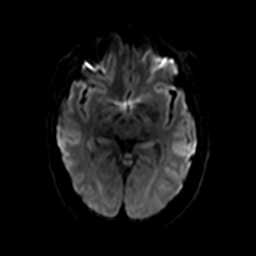
[im 60/80]
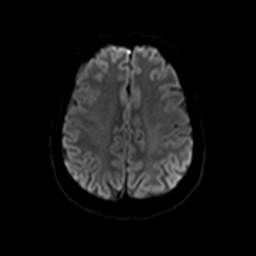
[im 80/80]
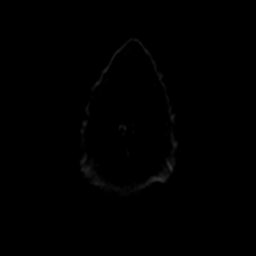

[Series 8: ax dwi_adc · axial · 3.0mm · 0.94mm/px · z∈[-42,+93]mm · 3 of 40 slices shown]
[im 1/40]
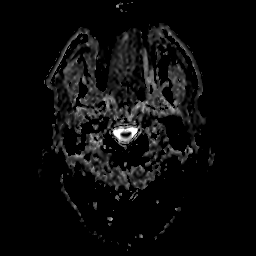
[im 20/40]
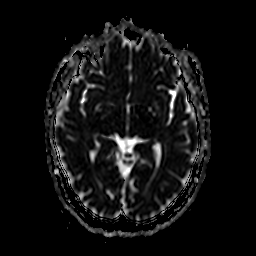
[im 40/40]
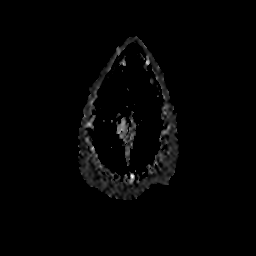

[Series 9: DWI · coronal · 5.0mm · 1.44mm/px · 4 of 64 slices shown (2 of 3)]
[im 1/64]
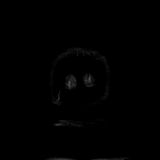
[im 22/64]
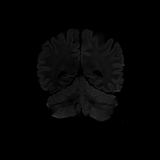
[im 43/64]
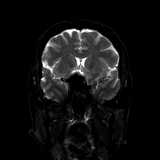
[im 64/64]
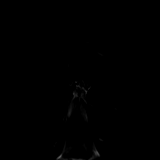

[Series 10: DWI · coronal · 5.0mm · 1.44mm/px · 2 of 32 slices shown (3 of 3)]
[im 1/32]
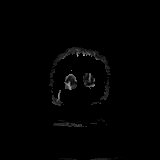
[im 32/32]
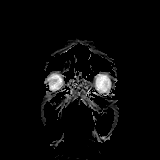

[Series 11: T2 · axial · 4.0mm · 0.36mm/px · z∈[-43,+91]mm · 2 of 28 slices shown (1 of 2)]
[im 1/28]
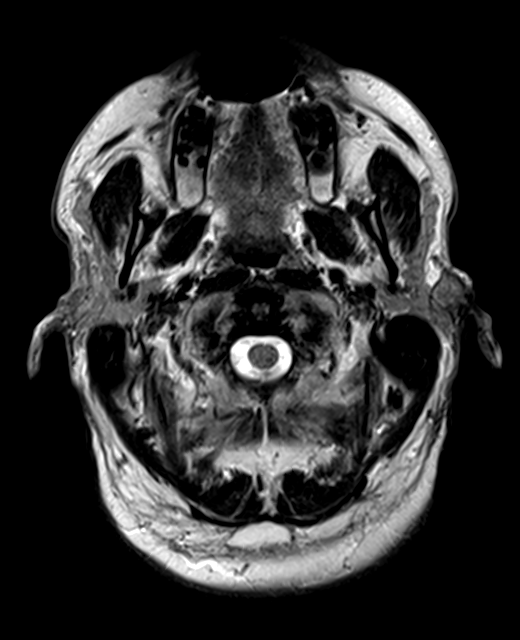
[im 28/28]
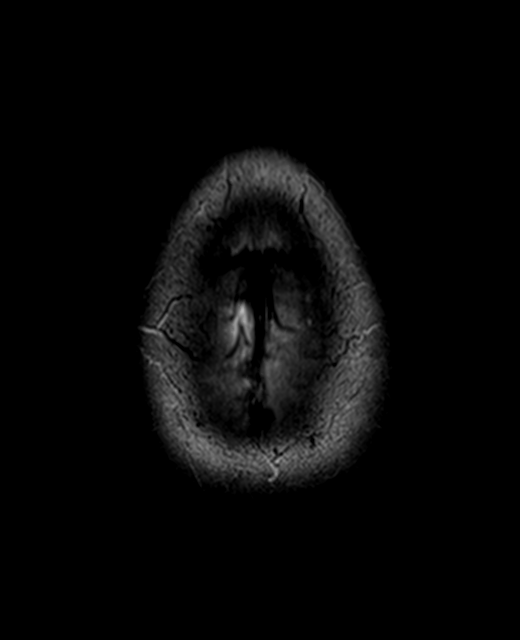

[Series 12: FLAIR · axial · 3.0mm · 0.72mm/px · z∈[-46,+98]mm · 2 of 26 slices shown]
[im 1/26]
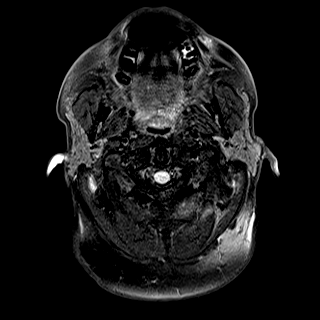
[im 26/26]
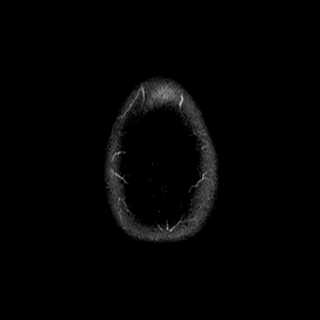

[Series 13: swi_images · axial · 1.5mm · 0.90mm/px · z∈[-45,+92]mm · 6 of 96 slices shown]
[im 1/96]
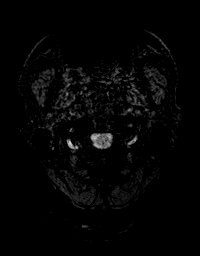
[im 20/96]
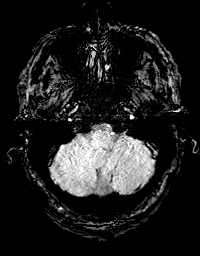
[im 39/96]
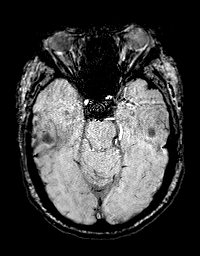
[im 58/96]
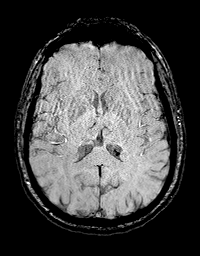
[im 77/96]
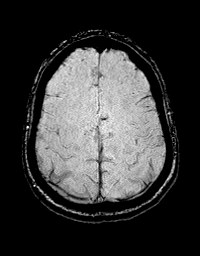
[im 96/96]
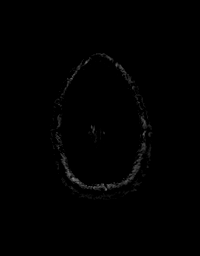

[Series 15: T1 · axial · 1.0mm · 0.94mm/px · z∈[-60,+95]mm · 10 of 160 slices shown (2 of 2)]
[im 1/160]
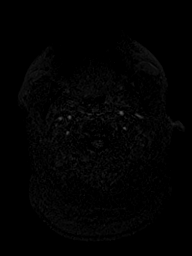
[im 18/160]
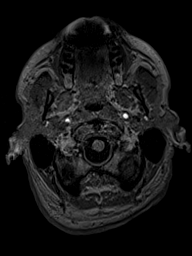
[im 36/160]
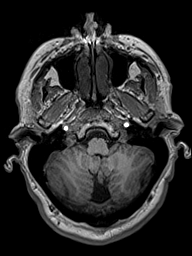
[im 54/160]
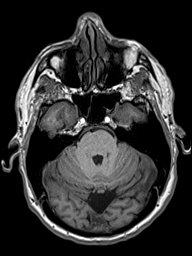
[im 71/160]
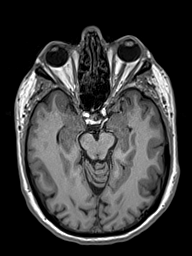
[im 89/160]
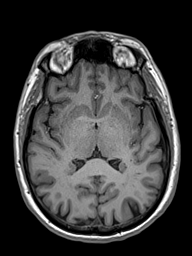
[im 107/160]
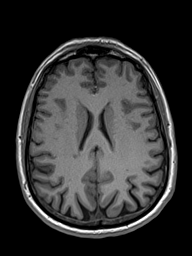
[im 124/160]
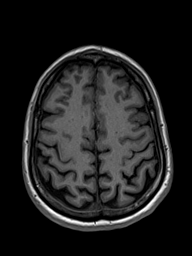
[im 142/160]
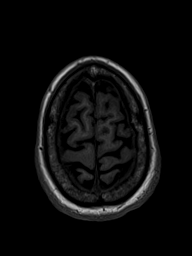
[im 160/160]
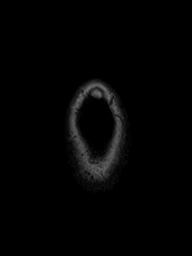

[Series 16: T2 · coronal · 4.5mm · 0.36mm/px · 2 of 30 slices shown (2 of 2)]
[im 1/30]
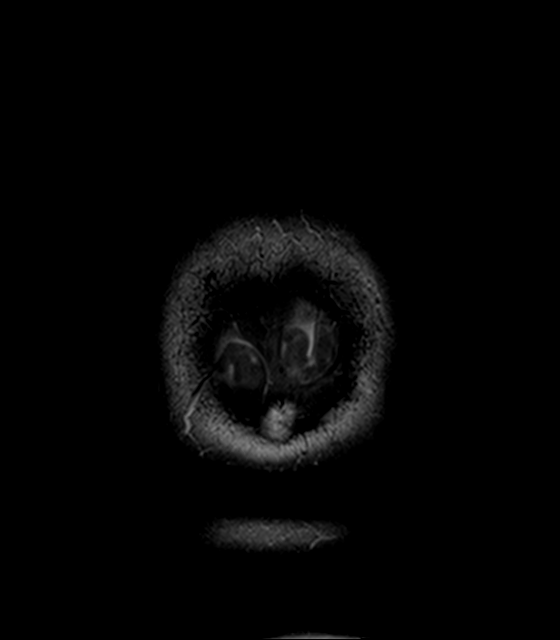
[im 30/30]
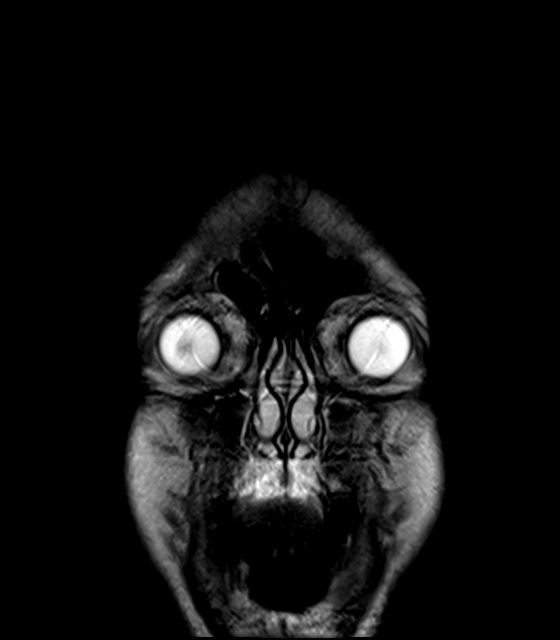

[48 of 48 positions shown; findings below may reference images not displayed]

FINDINGS: Brain: No restricted diffusion to suggest acute or subacute infarct.
No acute hemorrhage, mass, mass effect, or midline shift. No foci of
hemosiderin deposition to suggest remote hemorrhage or diffuse
axonal injury. No hydrocephalus or extra-axial collection.

Vascular: Normal flow voids.

Skull and upper cervical spine: Normal marrow signal.

Sinuses/Orbits: Mucosal thickening in the right ethmoid air cells.
The orbits are unremarkable.

Other: None.
IMPRESSION: No acute intracranial process or MRI evidence of remote trauma. No
etiology seen for the patient's headaches.

## 2023-10-03 DIAGNOSIS — Z125 Encounter for screening for malignant neoplasm of prostate: Secondary | ICD-10-CM | POA: Diagnosis not present

## 2023-10-03 DIAGNOSIS — F5221 Male erectile disorder: Secondary | ICD-10-CM | POA: Diagnosis not present

## 2023-10-03 DIAGNOSIS — Z0000 Encounter for general adult medical examination without abnormal findings: Secondary | ICD-10-CM | POA: Diagnosis not present

## 2023-10-04 DIAGNOSIS — M25551 Pain in right hip: Secondary | ICD-10-CM | POA: Diagnosis not present

## 2024-06-26 DIAGNOSIS — H43812 Vitreous degeneration, left eye: Secondary | ICD-10-CM | POA: Diagnosis not present

## 2024-06-26 DIAGNOSIS — D3131 Benign neoplasm of right choroid: Secondary | ICD-10-CM | POA: Diagnosis not present

## 2024-06-26 DIAGNOSIS — H35372 Puckering of macula, left eye: Secondary | ICD-10-CM | POA: Diagnosis not present

## 2024-06-26 DIAGNOSIS — H43821 Vitreomacular adhesion, right eye: Secondary | ICD-10-CM | POA: Diagnosis not present
